# Patient Record
Sex: Male | Born: 1965 | Race: White | Hispanic: No | Marital: Married | Smoking: Never smoker
Health system: Southern US, Community
[De-identification: ages and names within clinical notes are randomized; demographics above are authoritative.]

## PROBLEM LIST (undated history)

## (undated) DIAGNOSIS — E039 Hypothyroidism, unspecified: Secondary | ICD-10-CM

## (undated) HISTORY — PX: MANDIBLE SURGERY: SHX707

## (undated) HISTORY — PX: COLONOSCOPY: SHX174

---

## 2013-12-19 ENCOUNTER — Encounter: Payer: Self-pay | Admitting: Internal Medicine

## 2015-10-09 ENCOUNTER — Other Ambulatory Visit: Payer: Self-pay | Admitting: Pediatrics

## 2015-10-09 MED ORDER — CLINDAMYCIN HCL 300 MG PO CAPS: 300.0000 mg | ORAL_CAPSULE | Freq: Four times a day (QID) | ORAL | Status: AC

## 2015-10-09 MED ORDER — CLOTRIMAZOLE 1 % EX CREA: 1.0000 "application " | TOPICAL_CREAM | Freq: Two times a day (BID) | CUTANEOUS | Status: DC

## 2015-10-09 MED ORDER — FLUCONAZOLE 150 MG PO TABS: ORAL_TABLET | ORAL | Status: DC

## 2020-06-21 ENCOUNTER — Ambulatory Visit: Payer: Self-pay

## 2020-08-19 ENCOUNTER — Other Ambulatory Visit: Payer: Self-pay

## 2020-08-19 DIAGNOSIS — Z20822 Contact with and (suspected) exposure to covid-19: Secondary | ICD-10-CM

## 2020-08-21 LAB — SARS-COV-2, NAA 2 DAY TAT

## 2020-08-21 LAB — NOVEL CORONAVIRUS, NAA: SARS-CoV-2, NAA: NOT DETECTED

## 2021-04-16 ENCOUNTER — Emergency Department (HOSPITAL_COMMUNITY): Payer: BC Managed Care – PPO

## 2021-04-16 ENCOUNTER — Other Ambulatory Visit: Payer: Self-pay

## 2021-04-16 ENCOUNTER — Emergency Department (HOSPITAL_COMMUNITY)
Admission: EM | Admit: 2021-04-16 | Discharge: 2021-04-16 | Disposition: A | Discharge status: Home / Self Care | Payer: BC Managed Care – PPO | Attending: Emergency Medicine | Admitting: Emergency Medicine

## 2021-04-16 ENCOUNTER — Encounter (HOSPITAL_COMMUNITY): Payer: Self-pay | Admitting: Emergency Medicine

## 2021-04-16 DIAGNOSIS — S0990XA Unspecified injury of head, initial encounter: Secondary | ICD-10-CM | POA: Insufficient documentation

## 2021-04-16 DIAGNOSIS — R911 Solitary pulmonary nodule: Secondary | ICD-10-CM | POA: Insufficient documentation

## 2021-04-16 DIAGNOSIS — Y92410 Unspecified street and highway as the place of occurrence of the external cause: Secondary | ICD-10-CM | POA: Insufficient documentation

## 2021-04-16 LAB — COMPREHENSIVE METABOLIC PANEL
ALT: 16 U/L (ref 0–44)
AST: 24 U/L (ref 15–41)
Albumin: 4 g/dL (ref 3.5–5.0)
Alkaline Phosphatase: 33 U/L — ABNORMAL LOW (ref 38–126)
Anion gap: 9 (ref 5–15)
BUN: 14 mg/dL (ref 6–20)
CO2: 25 mmol/L (ref 22–32)
Calcium: 9 mg/dL (ref 8.9–10.3)
Chloride: 107 mmol/L (ref 98–111)
Creatinine, Ser: 0.99 mg/dL (ref 0.61–1.24)
GFR, Estimated: >60 mL/min (ref >60)
Glucose, Bld: 94 mg/dL (ref 70–99)
Potassium: 3.7 mmol/L (ref 3.5–5.1)
Sodium: 141 mmol/L (ref 135–145)
Total Bilirubin: 1 mg/dL (ref 0.3–1.2)
Total Protein: 6.9 g/dL (ref 6.5–8.1)

## 2021-04-16 LAB — CBC
HCT: 45.4 % (ref 39.0–52.0)
Hemoglobin: 14.9 g/dL (ref 13.0–17.0)
MCH: 31.2 pg (ref 26.0–34.0)
MCHC: 32.8 g/dL (ref 30.0–36.0)
MCV: 95 fL (ref 80.0–100.0)
Platelets: 198 10*3/uL (ref 150–400)
RBC: 4.78 MIL/uL (ref 4.22–5.81)
RDW: 12.4 % (ref 11.5–15.5)
WBC: 6.1 10*3/uL (ref 4.0–10.5)
nRBC: 0 % (ref 0.0–0.2)

## 2021-04-16 LAB — ETHANOL: Alcohol, Ethyl (B): <10 mg/dL (ref <10)

## 2021-04-16 MED ORDER — ACETAMINOPHEN 325 MG PO TABS: 650.0000 mg | ORAL_TABLET | Freq: Once | ORAL | Status: DC

## 2021-04-16 MED ORDER — METHOCARBAMOL 500 MG PO TABS: 500.0000 mg | ORAL_TABLET | Freq: Two times a day (BID) | ORAL | 0 refills | Status: DC

## 2021-04-16 NOTE — ED Notes (Signed)
Patient transported to CT 
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
## 2021-04-16 NOTE — Discharge Instructions (Addendum)
You are seenYou were evaluated in the Emergency Department and after careful evaluation, we did not find any emergent condition requiring admission or further testing in the hospital.  Your scans today were overall reassuring. The  CT scan of your thoracic spine  noted a nodule on your right upper lung.  This may be benign, but I do recommend that you follow-up with your primary care doctor for this.  You will likely experience aches and pains, you may take Tylenol or ibuprofen for pain.  I have also prescribed a muscle relaxer, do not drink or drive on this medication and take this medication at night as it can make you sleepy.  You may experience concussion symptoms including headache, sensitivity to light.  Please abstain from looking at screens, and take it easy over the next few days.  Please return to the ER if you have worsening confusion, sleepiness, nausea, vomiting, or any other new or concerning symptoms.  Please return to the Emergency Department if you experience any worsening of your condition.  We encourage you to follow up with a primary care provider.  Thank you for allowing [place] to be a part of your care.
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
## 2021-07-14 ENCOUNTER — Other Ambulatory Visit: Payer: Self-pay | Admitting: Family Medicine

## 2021-07-14 DIAGNOSIS — R911 Solitary pulmonary nodule: Secondary | ICD-10-CM

## 2021-08-05 ENCOUNTER — Other Ambulatory Visit: Payer: Self-pay

## 2021-08-05 ENCOUNTER — Ambulatory Visit
Admission: RE | Admit: 2021-08-05 | Discharge: 2021-08-05 | Disposition: A | Discharge status: Home / Self Care | Payer: BC Managed Care – PPO | Source: Ambulatory Visit | Attending: Family Medicine | Admitting: Family Medicine

## 2021-08-05 DIAGNOSIS — R911 Solitary pulmonary nodule: Secondary | ICD-10-CM

## 2021-08-26 ENCOUNTER — Ambulatory Visit: Payer: Self-pay | Admitting: Surgery

## 2021-08-26 NOTE — H&P (Signed)
History of Present Illness: Randy Peters is a 56 y.o. male who was referred to me for evaluation of a mass on the lower back.  He says it has been present for very long time, about 20 years.  It occasionally drains fluid which she says is foul-smelling.  It does not cause him pain but he would like to have it removed.  He is otherwise in good health.       Review of Systems: A complete review of systems was obtained from the patient.  I have reviewed this information and discussed as appropriate with the patient.  See HPI as well for other ROS.       Medical History: Past Medical History Past Medical History: Diagnosis Date  Thyroid disease        Patient Active Problem List Diagnosis  Epidermoid cyst of skin of back     Past Surgical History Past Surgical History: Procedure Laterality Date  jaw surgery   1985      Allergies No Known Allergies    Current Outpatient Medications on File Prior to Visit Medication Sig Dispense Refill  levothyroxine (SYNTHROID) 50 MCG tablet TAKE 1 TABLET BY MOUTH EVERY DAY IN THE MORNING ON AN EMPTY STOMACH       No current facility-administered medications on file prior to visit.     Family History Family History Problem Relation Age of Onset  Stroke Father        Social History   Tobacco Use Smoking Status Never Smokeless Tobacco Never     Social History Social History    Socioeconomic History  Marital status: Married Tobacco Use  Smoking status: Never  Smokeless tobacco: Never Substance and Sexual Activity  Alcohol use: Never  Drug use: Never      Objective:     Vitals:   08/26/21 1055 BP: 110/84 Pulse: 67 Temp: 36.6 C (97.9 F) SpO2: 98% Weight: 67.5 kg (148 lb 12.8 oz) Height: 180.3 cm (5\' 11" )   Body mass index is 20.75 kg/m.   Physical Exam Vitals reviewed.  Constitutional:      General: He is not in acute distress.    Appearance: Normal appearance.  HENT:     Head: Normocephalic and  atraumatic.  Eyes:     General: No scleral icterus.    Conjunctiva/sclera: Conjunctivae normal.  Cardiovascular:     Rate and Rhythm: Normal rate and regular rhythm.  Pulmonary:     Effort: Pulmonary effort is normal. No respiratory distress.     Breath sounds: Normal breath sounds.  Musculoskeletal:        General: No swelling or deformity. Normal range of motion.     Cervical back: Normal range of motion.  Skin:    General: Skin is warm and dry.     Comments: Exophytic subcutaneous mass on the lower back just to the left of midline, approximately 4 cm in diameter.  Mass is fluctuant but there is no overlying skin erythema or induration.  Neurological:     General: No focal deficit present.     Mental Status: He is alert and oriented to person, place, and time.  Psychiatric:        Mood and Affect: Mood normal.        Behavior: Behavior normal.        Thought Content: Thought content normal.            Assessment and Plan: Diagnoses and all orders for this visit:   Epidermoid cyst  of skin of back       This is a 56 year old male with a benign cystic lesion on the lower back, present for many years.  He would like to have it removed.  I recommended excision at one of the outpatient surgery centers.  I discussed the details of the procedure and he agrees to proceed.  He will be contacted to schedule an elective surgery date.  Michaelle Birks, MD [facility] General, Hepatobiliary and Pancreatic Surgery 08/26/21 11:50 AM
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
## 2021-12-17 ENCOUNTER — Encounter (HOSPITAL_BASED_OUTPATIENT_CLINIC_OR_DEPARTMENT_OTHER): Payer: Self-pay | Admitting: Surgery

## 2021-12-17 ENCOUNTER — Other Ambulatory Visit: Payer: Self-pay

## 2021-12-24 ENCOUNTER — Ambulatory Visit: Payer: Self-pay | Admitting: Surgery

## 2021-12-25 ENCOUNTER — Encounter (HOSPITAL_BASED_OUTPATIENT_CLINIC_OR_DEPARTMENT_OTHER): Payer: Self-pay | Admitting: Surgery

## 2021-12-25 ENCOUNTER — Ambulatory Visit (HOSPITAL_BASED_OUTPATIENT_CLINIC_OR_DEPARTMENT_OTHER)
Admission: RE | Admit: 2021-12-25 | Discharge: 2021-12-25 | Disposition: A | Discharge status: Home / Self Care | Payer: BC Managed Care – PPO | Attending: Surgery | Admitting: Surgery

## 2021-12-25 ENCOUNTER — Other Ambulatory Visit: Payer: Self-pay

## 2021-12-25 ENCOUNTER — Encounter (HOSPITAL_BASED_OUTPATIENT_CLINIC_OR_DEPARTMENT_OTHER)
Admission: RE | Disposition: A | Discharge status: Home / Self Care | Payer: Self-pay | Source: Home / Self Care | Attending: Surgery

## 2021-12-25 ENCOUNTER — Ambulatory Visit (HOSPITAL_BASED_OUTPATIENT_CLINIC_OR_DEPARTMENT_OTHER): Payer: BC Managed Care – PPO | Admitting: Anesthesiology

## 2021-12-25 DIAGNOSIS — L720 Epidermal cyst: Secondary | ICD-10-CM | POA: Diagnosis present

## 2021-12-25 DIAGNOSIS — E039 Hypothyroidism, unspecified: Secondary | ICD-10-CM | POA: Insufficient documentation

## 2021-12-25 HISTORY — DX: Hypothyroidism, unspecified: E03.9

## 2021-12-25 HISTORY — PX: CYST EXCISION: SHX5701

## 2021-12-25 SURGERY — CYST REMOVAL
Anesthesia: General | Site: Back

## 2021-12-25 MED ORDER — 0.9 % SODIUM CHLORIDE (POUR BTL) OPTIME
TOPICAL | Status: DC | PRN
  Administered 2021-12-25: 120 mL

## 2021-12-25 MED ORDER — KETOROLAC TROMETHAMINE 30 MG/ML IJ SOLN
INTRAMUSCULAR | Status: DC | PRN
  Administered 2021-12-25: 30 mg via INTRAVENOUS

## 2021-12-25 MED ORDER — MIDAZOLAM HCL 2 MG/2ML IJ SOLN
INTRAMUSCULAR | Status: AC
  Filled 2021-12-25: qty 2

## 2021-12-25 MED ORDER — FENTANYL CITRATE (PF) 100 MCG/2ML IJ SOLN
INTRAMUSCULAR | Status: AC
  Filled 2021-12-25: qty 2

## 2021-12-25 MED ORDER — LIDOCAINE 2% (20 MG/ML) 5 ML SYRINGE
INTRAMUSCULAR | Status: AC
  Filled 2021-12-25: qty 5

## 2021-12-25 MED ORDER — DEXAMETHASONE SODIUM PHOSPHATE 4 MG/ML IJ SOLN
INTRAMUSCULAR | Status: DC | PRN
Start: 2021-12-25 — End: 2021-12-25
  Administered 2021-12-25: 10 mg via INTRAVENOUS

## 2021-12-25 MED ORDER — BUPIVACAINE-EPINEPHRINE (PF) 0.25% -1:200000 IJ SOLN
INTRAMUSCULAR | Status: DC | PRN
  Administered 2021-12-25: 20 mL via PERINEURAL

## 2021-12-25 MED ORDER — ONDANSETRON HCL 4 MG/2ML IJ SOLN: 4.0000 mg | Freq: Once | INTRAMUSCULAR | Status: DC | PRN

## 2021-12-25 MED ORDER — PROPOFOL 10 MG/ML IV BOLUS
INTRAVENOUS | Status: DC | PRN
Start: 2021-12-25 — End: 2021-12-25
  Administered 2021-12-25: 150 mg via INTRAVENOUS

## 2021-12-25 MED ORDER — OXYCODONE HCL 5 MG/5ML PO SOLN: 5.0000 mg | Freq: Once | ORAL | Status: DC | PRN

## 2021-12-25 MED ORDER — ONDANSETRON HCL 4 MG/2ML IJ SOLN
INTRAMUSCULAR | Status: DC | PRN
  Administered 2021-12-25: 4 mg via INTRAVENOUS

## 2021-12-25 MED ORDER — OXYCODONE HCL 5 MG PO TABS: 5.0000 mg | ORAL_TABLET | Freq: Once | ORAL | Status: DC | PRN

## 2021-12-25 MED ORDER — CEFAZOLIN SODIUM-DEXTROSE 2-4 GM/100ML-% IV SOLN
INTRAVENOUS | Status: AC
  Filled 2021-12-25: qty 100

## 2021-12-25 MED ORDER — BUPIVACAINE HCL (PF) 0.25 % IJ SOLN
INTRAMUSCULAR | Status: AC
  Filled 2021-12-25: qty 30

## 2021-12-25 MED ORDER — LACTATED RINGERS IV SOLN: INTRAVENOUS | Status: DC

## 2021-12-25 MED ORDER — ACETAMINOPHEN 500 MG PO TABS
1000.0000 mg | ORAL_TABLET | Freq: Once | ORAL | Status: AC
  Administered 2021-12-25: 1000 mg via ORAL

## 2021-12-25 MED ORDER — MIDAZOLAM HCL 5 MG/5ML IJ SOLN
INTRAMUSCULAR | Status: DC | PRN
  Administered 2021-12-25: 2 mg via INTRAVENOUS

## 2021-12-25 MED ORDER — AMISULPRIDE (ANTIEMETIC) 5 MG/2ML IV SOLN: 10.0000 mg | Freq: Once | INTRAVENOUS | Status: DC | PRN

## 2021-12-25 MED ORDER — ONDANSETRON HCL 4 MG/2ML IJ SOLN
INTRAMUSCULAR | Status: AC
  Filled 2021-12-25: qty 2

## 2021-12-25 MED ORDER — ACETAMINOPHEN 500 MG PO TABS
ORAL_TABLET | ORAL | Status: AC
  Filled 2021-12-25: qty 2

## 2021-12-25 MED ORDER — TRAMADOL HCL 50 MG PO TABS: 50.0000 mg | ORAL_TABLET | Freq: Four times a day (QID) | ORAL | 0 refills | Status: AC | PRN

## 2021-12-25 MED ORDER — ROCURONIUM BROMIDE 100 MG/10ML IV SOLN
INTRAVENOUS | Status: DC | PRN
  Administered 2021-12-25: 60 mg via INTRAVENOUS

## 2021-12-25 MED ORDER — FENTANYL CITRATE (PF) 100 MCG/2ML IJ SOLN: 25.0000 ug | INTRAMUSCULAR | Status: DC | PRN

## 2021-12-25 MED ORDER — SUGAMMADEX SODIUM 200 MG/2ML IV SOLN
INTRAVENOUS | Status: DC | PRN
  Administered 2021-12-25: 200 mg via INTRAVENOUS

## 2021-12-25 MED ORDER — DEXAMETHASONE SODIUM PHOSPHATE 10 MG/ML IJ SOLN
INTRAMUSCULAR | Status: AC
  Filled 2021-12-25: qty 1

## 2021-12-25 MED ORDER — ROCURONIUM BROMIDE 10 MG/ML (PF) SYRINGE
PREFILLED_SYRINGE | INTRAVENOUS | Status: AC
  Filled 2021-12-25: qty 10

## 2021-12-25 MED ORDER — PROPOFOL 500 MG/50ML IV EMUL
INTRAVENOUS | Status: AC
  Filled 2021-12-25: qty 50

## 2021-12-25 MED ORDER — CEFAZOLIN SODIUM-DEXTROSE 2-4 GM/100ML-% IV SOLN
2.0000 g | INTRAVENOUS | Status: AC
  Administered 2021-12-25: 2 g via INTRAVENOUS

## 2021-12-25 MED ORDER — FENTANYL CITRATE (PF) 100 MCG/2ML IJ SOLN
INTRAMUSCULAR | Status: DC | PRN
  Administered 2021-12-25 (×2): 50 ug via INTRAVENOUS

## 2021-12-25 SURGICAL SUPPLY — 43 items
ADH SKN CLS APL DERMABOND .7 (GAUZE/BANDAGES/DRESSINGS)
APL PRP STRL LF DISP 70% ISPRP (MISCELLANEOUS) ×1
APL SKNCLS STERI-STRIP NONHPOA (GAUZE/BANDAGES/DRESSINGS)
BENZOIN TINCTURE PRP APPL 2/3 (GAUZE/BANDAGES/DRESSINGS) IMPLANT
BLADE SURG 15 STRL LF DISP TIS (BLADE) ×1 IMPLANT
BLADE SURG 15 STRL SS (BLADE) ×2
CANISTER SUCT 1200ML W/VALVE (MISCELLANEOUS) IMPLANT
CHLORAPREP W/TINT 26 (MISCELLANEOUS) ×2 IMPLANT
COVER BACK TABLE 60X90IN (DRAPES) ×2 IMPLANT
COVER MAYO STAND STRL (DRAPES) ×2 IMPLANT
DERMABOND ADVANCED (GAUZE/BANDAGES/DRESSINGS)
DERMABOND ADVANCED .7 DNX12 (GAUZE/BANDAGES/DRESSINGS) IMPLANT
DRAPE LAPAROTOMY 100X72 PEDS (DRAPES) ×2 IMPLANT
DRAPE UTILITY XL STRL (DRAPES) ×2 IMPLANT
DRSG TEGADERM 2-3/8X2-3/4 SM (GAUZE/BANDAGES/DRESSINGS) IMPLANT
DRSG TEGADERM 4X4.75 (GAUZE/BANDAGES/DRESSINGS) IMPLANT
ELECT REM PT RETURN 9FT ADLT (ELECTROSURGICAL) ×2
ELECTRODE REM PT RTRN 9FT ADLT (ELECTROSURGICAL) ×1 IMPLANT
GAUZE SPONGE 4X4 12PLY STRL LF (GAUZE/BANDAGES/DRESSINGS) IMPLANT
GLOVE BIOGEL PI IND STRL 6 (GLOVE) ×1 IMPLANT
GLOVE BIOGEL PI INDICATOR 6 (GLOVE) ×1
GLOVE SURG POLY MICRO LF SZ5.5 (GLOVE) ×2 IMPLANT
GOWN STRL REUS W/ TWL LRG LVL3 (GOWN DISPOSABLE) ×2 IMPLANT
GOWN STRL REUS W/TWL LRG LVL3 (GOWN DISPOSABLE) ×4
NDL HYPO 25X1 1.5 SAFETY (NEEDLE) ×1 IMPLANT
NEEDLE HYPO 25X1 1.5 SAFETY (NEEDLE) ×2 IMPLANT
PACK BASIN DAY SURGERY FS (CUSTOM PROCEDURE TRAY) ×2 IMPLANT
PENCIL SMOKE EVACUATOR (MISCELLANEOUS) ×2 IMPLANT
SLEEVE SCD COMPRESS KNEE MED (STOCKING) ×2 IMPLANT
SPIKE FLUID TRANSFER (MISCELLANEOUS) IMPLANT
SPONGE T-LAP 4X18 ~~LOC~~+RFID (SPONGE) ×2 IMPLANT
STRIP CLOSURE SKIN 1/2X4 (GAUZE/BANDAGES/DRESSINGS) IMPLANT
SUT MON AB 4-0 PC3 18 (SUTURE) ×2 IMPLANT
SUT SILK 2 0 TIES 17X18 (SUTURE)
SUT SILK 2-0 18XBRD TIE BLK (SUTURE) IMPLANT
SUT VIC AB 3-0 SH 27 (SUTURE) ×2
SUT VIC AB 3-0 SH 27X BRD (SUTURE) ×1 IMPLANT
SUT VICRYL 3-0 CR8 SH (SUTURE) IMPLANT
SYR BULB EAR ULCER 3OZ GRN STR (SYRINGE) ×1 IMPLANT
SYR CONTROL 10ML LL (SYRINGE) ×2 IMPLANT
TOWEL GREEN STERILE FF (TOWEL DISPOSABLE) ×2 IMPLANT
TUBE CONNECTING 20X1/4 (TUBING) IMPLANT
YANKAUER SUCT BULB TIP NO VENT (SUCTIONS) IMPLANT

## 2021-12-25 NOTE — Anesthesia Postprocedure Evaluation (Signed)
Anesthesia Post Note ? ?Patient: Randy Peters ? ?Procedure(s) Performed: EXCISION OF LOWER BACK CYST (Back) ? ?  ? ?Patient location during evaluation: PACU ?Anesthesia Type: General ?Level of consciousness: awake and alert, patient cooperative and oriented ?Pain management: pain level controlled ?Vital Signs Assessment: post-procedure vital signs reviewed and stable ?Respiratory status: spontaneous breathing, nonlabored ventilation and respiratory function stable ?Cardiovascular status: blood pressure returned to baseline and stable ?Postop Assessment: no apparent nausea or vomiting, able to ambulate and adequate PO intake ?Anesthetic complications: no ? ? ?No notable events documented. ? ?Last Vitals:  ?Vitals:  ? 12/25/21 0915 12/25/21 0939  ?BP: 99/70 98/73  ?Pulse: (!) 54 (!) 51  ?Resp: 17 18  ?Temp:  (!) 36.3 ?C  ?SpO2: 100% 98%  ?  ?Last Pain:  ?Vitals:  ? 12/25/21 0939  ?TempSrc: Oral  ?PainSc: 0-No pain  ? ? ?  ?  ?  ?  ?  ?  ? ?Eilee Schader,E. Azel Gumina ? ? ? ? ?

## 2021-12-25 NOTE — Anesthesia Preprocedure Evaluation (Signed)
Anesthesia Evaluation  ?Patient identified by MRN, date of birth, ID band ?Patient awake ? ? ? ?Reviewed: ?Allergy & Precautions, NPO status , Patient's Chart, lab work & pertinent test results ? ?History of Anesthesia Complications ?Negative for: history of anesthetic complications ? ?Airway ?Mallampati: II ? ?TM Distance: >3 FB ?Neck ROM: Full ? ? ? Dental ? ?(+) Dental Advisory Given, Teeth Intact ?  ?Pulmonary ?neg pulmonary ROS,  ?  ?Pulmonary exam normal ? ? ? ? ? ? ? Cardiovascular ?negative cardio ROS ?Normal cardiovascular exam ? ? ?  ?Neuro/Psych ?negative neurological ROS ?   ? GI/Hepatic ?negative GI ROS, Neg liver ROS,   ?Endo/Other  ?Hypothyroidism  ? Renal/GU ?negative Renal ROS  ?negative genitourinary ?  ?Musculoskeletal ?negative musculoskeletal ROS ?(+)  ? Abdominal ?  ?Peds ? Hematology ?negative hematology ROS ?(+)   ?Anesthesia Other Findings ? ? Reproductive/Obstetrics ? ?  ? ? ? ? ? ? ? ? ? ? ? ? ? ?  ?  ? ? ? ? ? ? ? ?Anesthesia Physical ?Anesthesia Plan ? ?ASA: 2 ? ?Anesthesia Plan: General  ? ?Post-op Pain Management: Tylenol PO (pre-op)* and Toradol IV (intra-op)*  ? ?Induction: Intravenous ? ?PONV Risk Score and Plan: 2 and Ondansetron, Dexamethasone, Treatment may vary due to age or medical condition and Midazolam ? ?Airway Management Planned: Oral ETT ? ?Additional Equipment: None ? ?Intra-op Plan:  ? ?Post-operative Plan: Extubation in OR ? ?Informed Consent: I have reviewed the patients History and Physical, chart, labs and discussed the procedure including the risks, benefits and alternatives for the proposed anesthesia with the patient or authorized representative who has indicated his/her understanding and acceptance.  ? ? ? ?Dental advisory given ? ?Plan Discussed with:  ? ?Anesthesia Plan Comments:   ? ? ? ? ? ? ?Anesthesia Quick Evaluation ? ?

## 2021-12-25 NOTE — Op Note (Signed)
Date: 12/25/21 ? ?Patient: Randy Peters ?MRN: [id number] ? ?Preoperative Diagnosis: Cyst of lower back ?Postoperative Diagnosis: Inclusion cyst of lower back ? ?Procedure: Excision of epidermal inclusion cyst ? ?Surgeon: Michaelle Birks, MD ? ?EBL: Minimal ? ?Anesthesia: General ? ?Specimens: Lower back cyst ? ?Indications: Mr. Yohey is a 56 yo male who presented with a cystic mass on the lower back for many years, which intermittently drained fluid. He elected to proceed with surgical excision. ? ?Findings: 4cm inclusion cyst on the lower back near the midline. ? ?Procedure details: Informed consent was obtained in the preoperative area prior to the procedure. The patient was brought to the operating room, general anesthesia was induced, and the patient was placed in the prone position. Perioperative antibiotics were administered per SCIP guidelines. The patient was prepped and draped in the usual sterile fashion. A pre-procedure timeout was taken verifying patient identity, surgical site and procedure to be performed. ? ?An elliptical skin incision was made overlying the cyst. The cyst was very superficial and was just deep to the dermis. The cyst wall was dissected off the surrounding subcutaneous tissue using blunt dissection and cautery. It was completely excised, and the excised diameter was approximately 4cm. The specimen was sent for routine pathology. The wound was irrigated and hemostasis was achieved with cautery. The redundant skin edges were sharply excised. The deep dermis was closed with interrupted 3-0 Vicryl sutures. The skin was closed with a running subcuticular 4-0 monocryl suture and dermabond was applied. ? ?The patient tolerated the procedure well with no apparent complications. All counts were correct x2 at the end of the procedure. The patient was extubated and taken to PACU in stable condition. ? ?Michaelle Birks, MD ?12/25/21 ?8:41 AM ? ? ?

## 2021-12-25 NOTE — Transfer of Care (Signed)
Immediate Anesthesia Transfer of Care Note ? ?Patient: Randy Peters ? ?Procedure(s) Performed: EXCISION OF LOWER BACK CYST (Back) ? ?Patient Location: PACU ? ?Anesthesia Type:General ? ?Level of Consciousness: sedated ? ?Airway & Oxygen Therapy: Patient Spontanous Breathing and Patient connected to face mask oxygen ? ?Post-op Assessment: Report given to RN and Post -op Vital signs reviewed and stable ? ?Post vital signs: Reviewed and stable ? ?Last Vitals:  ?Vitals Value Taken Time  ?BP 106/75 12/25/21 0855  ?Temp    ?Pulse 54 12/25/21 0857  ?Resp    ?SpO2 100 % 12/25/21 0857  ?Vitals shown include unvalidated device data. ? ?Last Pain:  ?Vitals:  ? 12/25/21 0704  ?TempSrc: Oral  ?PainSc: 0-No pain  ?   ? ?  ? ?Complications: No notable events documented. ?

## 2021-12-25 NOTE — H&P (Signed)
Randy Peters is an 56 y.o. male.   ?Chief Complaint: cyst on back ?HPI: Randy Peters is a 56 yo male with a cystic mass on the lower back. It has been present for many years and occasionally drains fluid. He would like to have it removed and presents today for surgical excision. ? ?Past Medical History:  ?Diagnosis Date  ? Hypothyroidism   ? ? ?Past Surgical History:  ?Procedure Laterality Date  ? COLONOSCOPY    ? MANDIBLE SURGERY    ? ? ?History reviewed. No pertinent family history. ?Social History:  reports that he has never smoked. He has never used smokeless tobacco. He reports current alcohol use. He reports that he does not use drugs. ? ?Allergies: No Known Allergies ? ?Medications Prior to Admission  ?Medication Sig Dispense Refill  ? levothyroxine (SYNTHROID) 50 MCG tablet Take 50 mcg by mouth daily before breakfast.    ? ? ?No results found for this or any previous visit (from the past 48 hour(s)). ?No results found. ? ?Review of Systems ? ?Blood pressure 110/83, pulse (!) 58, temperature 97.9 ?F (36.6 ?C), temperature source Oral, resp. rate 15, height 5\' 11"  (1.803 m), weight 68.4 kg, SpO2 100 %. ?Physical Exam ?Vitals reviewed.  ?Constitutional:   ?   General: He is not in acute distress. ?   Appearance: Normal appearance.  ?HENT:  ?   Head: Normocephalic and atraumatic.  ?Eyes:  ?   General: No scleral icterus. ?   Conjunctiva/sclera: Conjunctivae normal.  ?Pulmonary:  ?   Effort: Pulmonary effort is normal. No respiratory distress.  ?Musculoskeletal:     ?   General: No deformity. Normal range of motion.  ?Skin: ?   General: Skin is warm and dry.  ?   Comments: Exophytic mass on lower back to the left of midline, soft and well-circumscribed, approximately 3cm in diameter, no overlying skin defects.  ?Neurological:  ?   General: No focal deficit present.  ?   Mental Status: He is alert and oriented to person, place, and time.  ?Psychiatric:     ?   Mood and Affect: Mood normal.     ?   Behavior:  Behavior normal.     ?   Thought Content: Thought content normal.  ?  ? ?Assessment/Plan ?56 yo male with cyst on lower back. Proceed to OR for excision. Informed consent obtained, all questions answered, postop instructions reviewed. Plan for discharge home from PACU. ? ?58, MD ?12/25/2021, 7:25 AM ? ? ? ?

## 2021-12-25 NOTE — Anesthesia Procedure Notes (Signed)
Procedure Name: Intubation ?Date/Time: 12/25/2021 7:54 AM ?Performed by: Maryella Shivers, CRNA ?Pre-anesthesia Checklist: Patient identified, Emergency Drugs available, Suction available and Patient being monitored ?Patient Re-evaluated:Patient Re-evaluated prior to induction ?Oxygen Delivery Method: Circle system utilized ?Preoxygenation: Pre-oxygenation with 100% oxygen ?Induction Type: IV induction ?Ventilation: Mask ventilation with difficulty ?Laryngoscope Size: Mac and 4 ?Grade View: Grade III ?Tube type: Oral ?Tube size: 7.5 mm ?Number of attempts: 1 ?Airway Equipment and Method: Stylet and Oral airway ?Placement Confirmation: ETT inserted through vocal cords under direct vision, positive ETCO2 and breath sounds checked- equal and bilateral ?Secured at: 22 cm ?Tube secured with: Tape ?Dental Injury: Teeth and Oropharynx as per pre-operative assessment  ?Comments: Slightly anterior ? ? ? ? ?

## 2021-12-25 NOTE — Discharge Instructions (Addendum)
[facility] DISCHARGE INSTRUCTIONS ? ?Activity ?Ok to shower in 24 hours, but do not bathe or submerge incision underwater. ?Do not drive while taking narcotic pain medication. ? ?Wound Care ?Your incision is covered with skin glue called Dermabond. This will peel off on its own over time. ?You may shower and allow warm soapy water to run over your incision in 24 hours after surgery. Gently pat dry. ?Do not submerge your incision underwater. ?Monitor your incision for any new redness, tenderness, or drainage. ? ?When to Call us: ?Fever greater than 100.5 ?New redness, drainage, or swelling at incision site ?Severe pain, nausea, or vomiting ? ?Follow-up ?You will have a postoperative visit with Dr. Freida Busman in about 1 month. This will be at the [facility] office at [address], [address], [place], [place]. Please arrive at least 15 minutes prior to your scheduled appointment time. ? ?For questions or concerns, please call the office at [phone]. ? ? ?Post Anesthesia Home Care Instructions ? ?Activity: ?Get plenty of rest for the remainder of the day. A responsible individual must stay with you for 24 hours following the procedure.  ?For the next 24 hours, DO NOT: ?-Drive a car ?-Advertising copywriter ?-Drink alcoholic beverages ?-Take any medication unless instructed by your physician ?-Make any legal decisions or sign important papers. ? ?Meals: ?Start with liquid foods such as gelatin or soup. Progress to regular foods as tolerated. Avoid greasy, spicy, heavy foods. If nausea and/or vomiting occur, drink only clear liquids until the nausea and/or vomiting subsides. Call your physician if vomiting continues. ? ?Special Instructions/Symptoms: ?Your throat may feel dry or sore from the anesthesia or the breathing tube placed in your throat during surgery. If this causes discomfort, gargle with warm salt water. The discomfort should disappear within 24 hours. ? ?If you had a scopolamine  patch placed behind your ear for the management of post- operative nausea and/or vomiting: ? ?1. The medication in the patch is effective for 72 hours, after which it should be removed.  Wrap patch in a tissue and discard in the trash. Wash hands thoroughly with soap and water. ?2. You may remove the patch earlier than 72 hours if you experience unpleasant side effects which may include dry mouth, dizziness or visual disturbances. ?3. Avoid touching the patch. Wash your hands with soap and water after contact with the patch. ? ? ?No tylenol until after 1pm if needed today.  No ibuprofen until after 1pm if needed today. ?    ? ?

## 2021-12-28 ENCOUNTER — Encounter (HOSPITAL_BASED_OUTPATIENT_CLINIC_OR_DEPARTMENT_OTHER): Payer: Self-pay | Admitting: Surgery

## 2021-12-28 LAB — SURGICAL PATHOLOGY

## 2022-01-14 IMAGING — CT CT CHEST W/O CM
1 series · 15 of 34 positions shown, 19 images · non-contrast
Comparison: CT thoracic spine 04/16/2021

CLINICAL DATA: Lung nodule, follow-up, abnormal CT thoracic spine

EXAM:
CT CHEST WITHOUT CONTRAST
TECHNIQUE: Multidetector CT imaging of the chest was performed following the
standard protocol without IV contrast.

[Series 2: chest w/(date) · axial · 0.66mm/px · z∈[-324,-36]mm · 15 of 170 slices shown, 19 images]
[im 13/170  mediastinal]
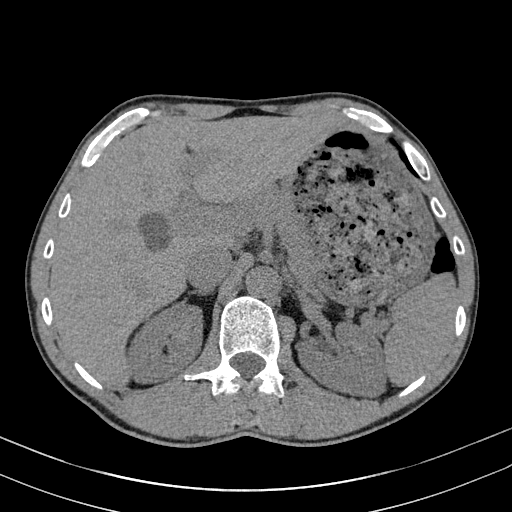
[im 13/170  lung]
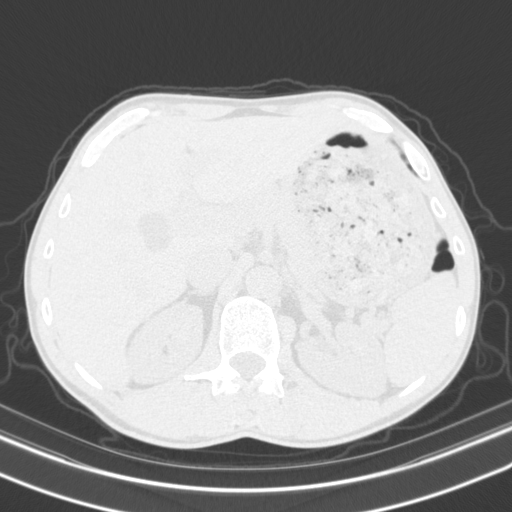
[im 26/170  lung]
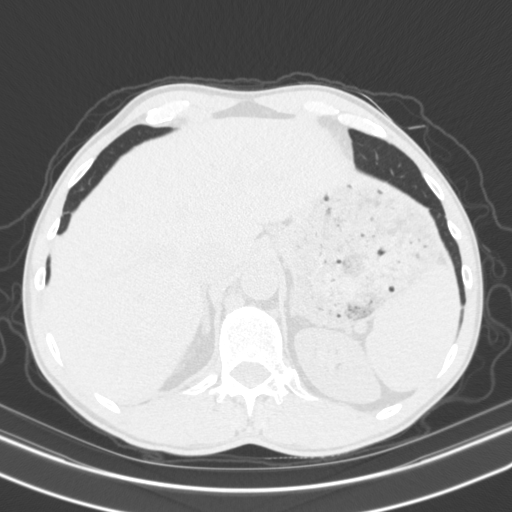
[im 34/170  lung]
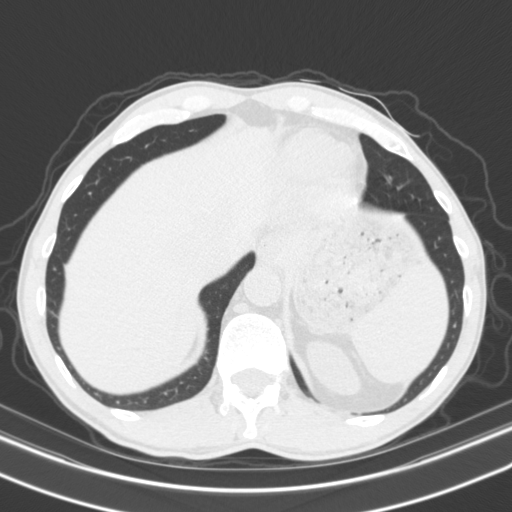
[im 44/170  lung]
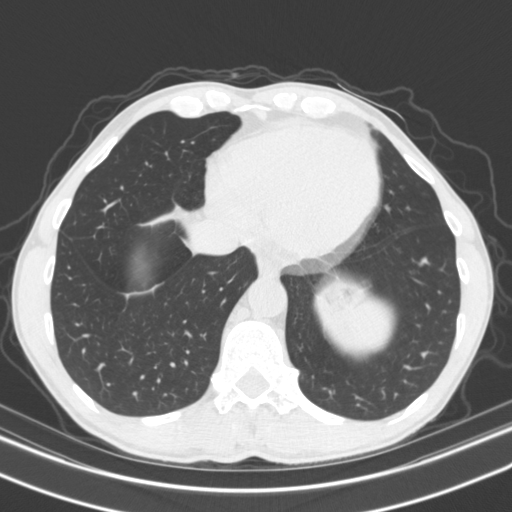
[im 57/170  mediastinal]
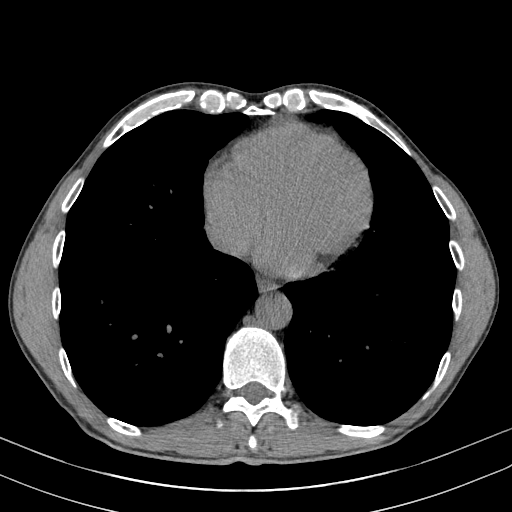
[im 57/170  lung]
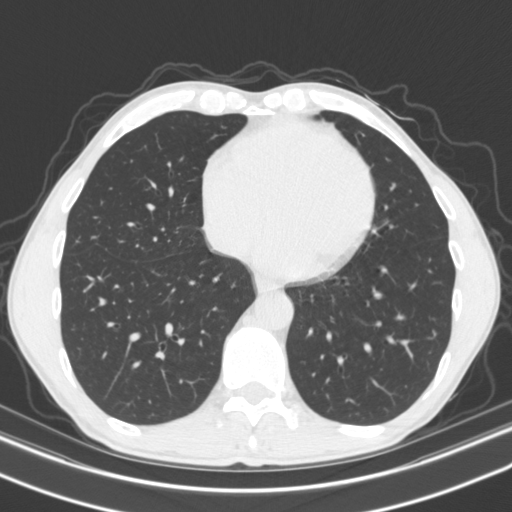
[im 68/170  lung]
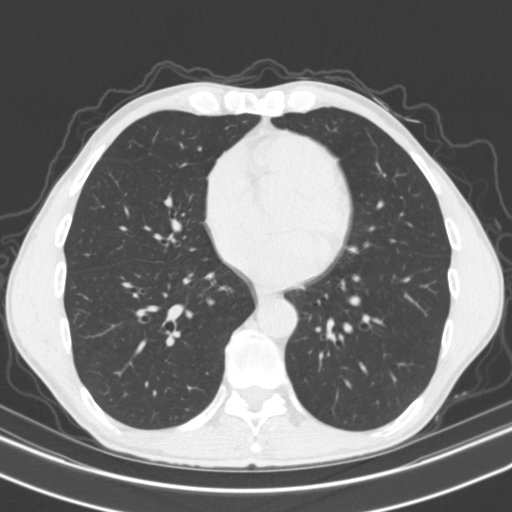
[im 76/170  lung]
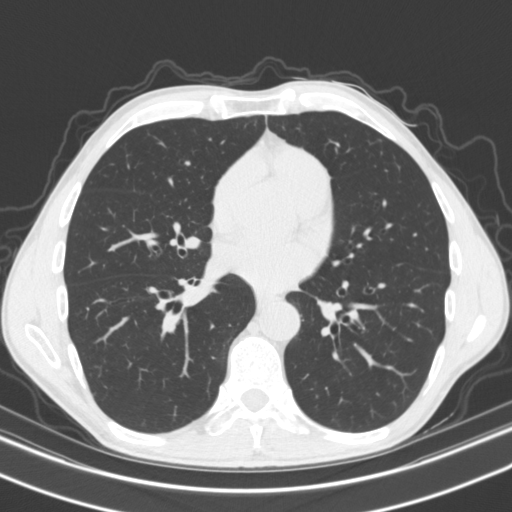
[im 88/170  lung]
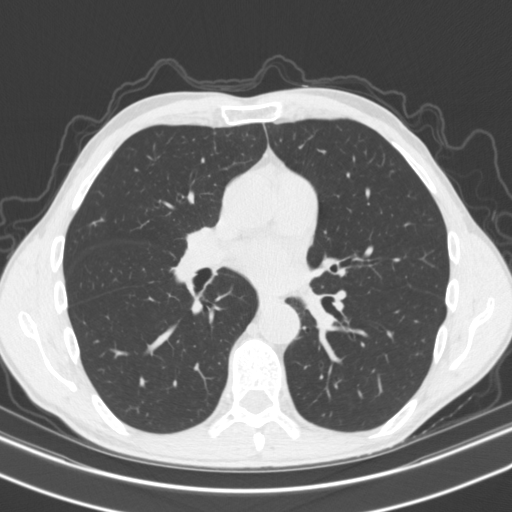
[im 94/170  mediastinal]
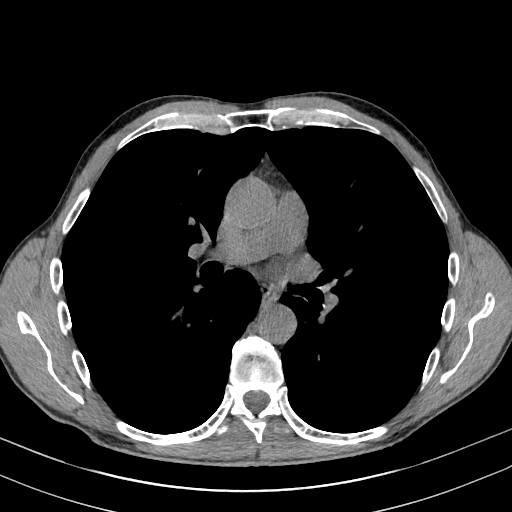
[im 94/170  lung]
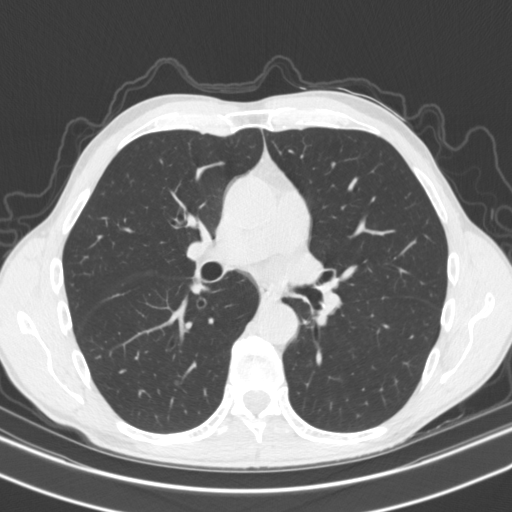
[im 102/170  lung]
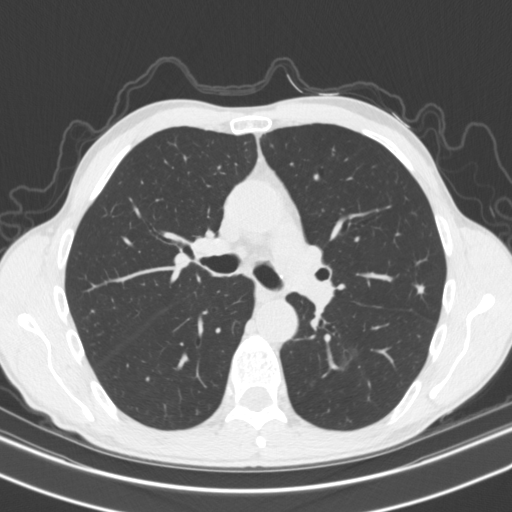
[im 113/170  lung]
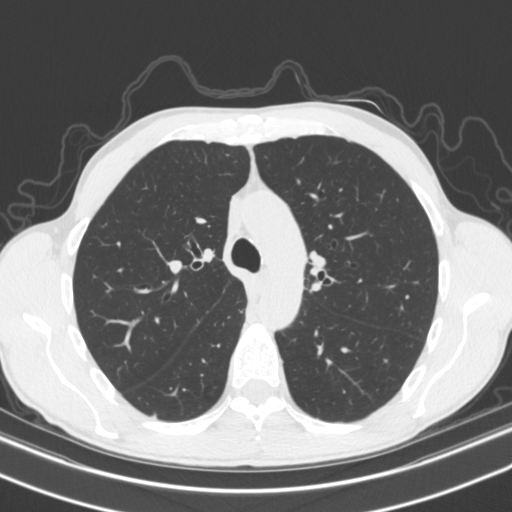
[im 126/170  lung]
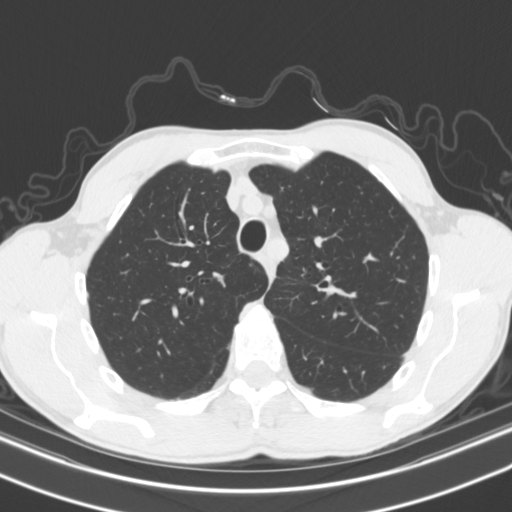
[im 136/170  mediastinal]
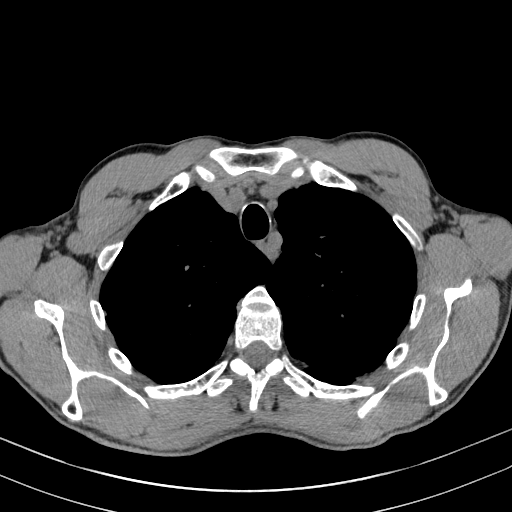
[im 136/170  lung]
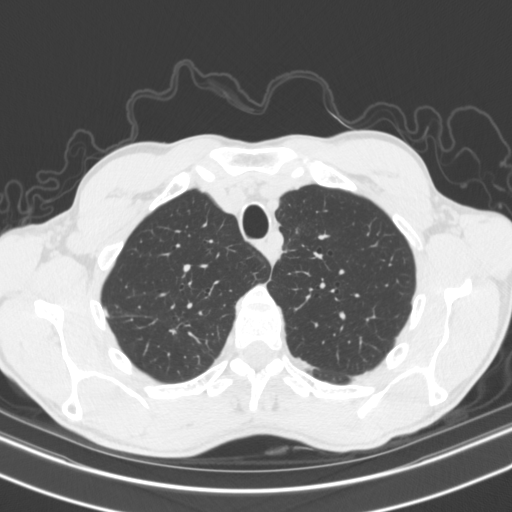
[im 144/170  lung]
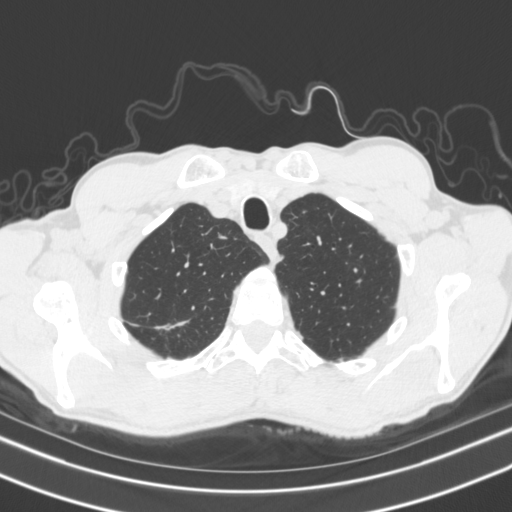
[im 157/170  lung]
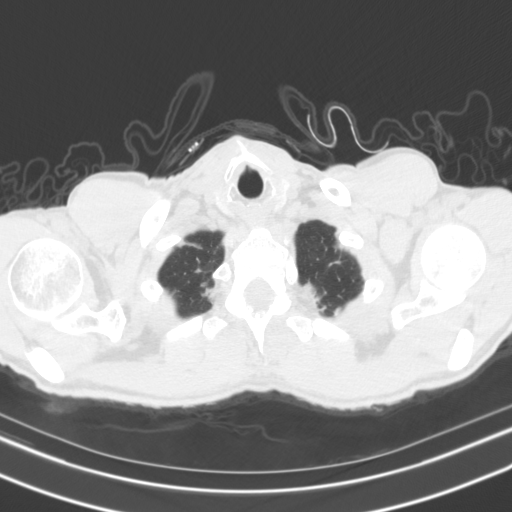

[15 of 34 positions shown; findings below may reference images not displayed]

FINDINGS: Cardiovascular: Aorta normal caliber. Heart size normal. No
pericardial effusion.

Mediastinum/Nodes: Base of cervical region normal appearance.
Esophagus unremarkable. No thoracic adenopathy.

Lungs/Pleura: Biapical scarring. Lenticular opacity in the RIGHT
upper lobe 18 x 7 mm, unchanged by my measurement, with linear
density extending to the peripheral margin of the lung, favor
scarring but requiring follow-up to demonstrate stability. Multiple
additional small nodular foci are seen in the upper lobes including
a 3 mm RIGHT upper lobe nodule image 28, 7 x 5 mm LEFT upper lobe
nodule image 40, 4 mm LEFT upper lobe nodules images 20 and 33, 2 mm
LEFT upper lobe nodule image 30 and a 12 x 6 mm LEFT upper lobe
nodule image 24. Observed nodules are stable since the previous
exam. Tiny nodule RIGHT lower lobe image 105 not previously imaged.

Upper Abdomen: Hepatic cyst centrally RIGHT lobe 2.5 x 1.8 cm.
Remaining visualized upper abdomen unremarkable.

Musculoskeletal: No acute osseous abnormalities.
IMPRESSION: Multiple BILATERAL upper lobe pulmonary nodules, the majority of
which are unchanged since the previous exam, with the largest in the
LEFT upper lobe measuring 12 x 6 mm.

Since these lesions are stable since the previous exam, follow-up CT
imaging recommended in 6 months to demonstrate continued stability.

Biapical scarring.

Hepatic cyst 2.5 x 1.8 cm.

## 2022-06-30 ENCOUNTER — Other Ambulatory Visit: Payer: Self-pay | Admitting: Family Medicine

## 2022-06-30 DIAGNOSIS — R911 Solitary pulmonary nodule: Secondary | ICD-10-CM

## 2022-08-02 ENCOUNTER — Ambulatory Visit
Admission: RE | Admit: 2022-08-02 | Discharge: 2022-08-02 | Disposition: A | Discharge status: Home / Self Care | Payer: BC Managed Care – PPO | Source: Ambulatory Visit | Attending: Family Medicine | Admitting: Family Medicine

## 2022-08-02 DIAGNOSIS — R911 Solitary pulmonary nodule: Secondary | ICD-10-CM

## 2023-12-20 ENCOUNTER — Other Ambulatory Visit (HOSPITAL_BASED_OUTPATIENT_CLINIC_OR_DEPARTMENT_OTHER): Payer: Self-pay | Admitting: Family Medicine

## 2023-12-20 DIAGNOSIS — E7800 Pure hypercholesterolemia, unspecified: Secondary | ICD-10-CM

## 2023-12-20 DIAGNOSIS — R918 Other nonspecific abnormal finding of lung field: Secondary | ICD-10-CM

## 2024-01-03 ENCOUNTER — Ambulatory Visit (HOSPITAL_COMMUNITY): Payer: Self-pay

## 2024-01-03 ENCOUNTER — Encounter (HOSPITAL_COMMUNITY): Payer: Self-pay

## 2024-03-21 ENCOUNTER — Other Ambulatory Visit (HOSPITAL_BASED_OUTPATIENT_CLINIC_OR_DEPARTMENT_OTHER): Payer: Self-pay | Admitting: Family Medicine

## 2024-03-21 DIAGNOSIS — E7800 Pure hypercholesterolemia, unspecified: Secondary | ICD-10-CM

## 2024-04-04 ENCOUNTER — Ambulatory Visit (HOSPITAL_BASED_OUTPATIENT_CLINIC_OR_DEPARTMENT_OTHER)
Admission: RE | Admit: 2024-04-04 | Discharge: 2024-04-04 | Disposition: A | Discharge status: Home / Self Care | Payer: Self-pay | Source: Ambulatory Visit | Attending: Family Medicine | Admitting: Family Medicine

## 2024-04-04 DIAGNOSIS — E7800 Pure hypercholesterolemia, unspecified: Secondary | ICD-10-CM | POA: Insufficient documentation

## 2024-04-10 ENCOUNTER — Other Ambulatory Visit: Payer: Self-pay | Admitting: Family Medicine

## 2024-04-10 ENCOUNTER — Encounter: Payer: Self-pay | Admitting: Family Medicine

## 2024-04-10 DIAGNOSIS — R918 Other nonspecific abnormal finding of lung field: Secondary | ICD-10-CM

## 2024-04-12 ENCOUNTER — Ambulatory Visit
Admission: RE | Admit: 2024-04-12 | Discharge: 2024-04-12 | Disposition: A | Discharge status: Home / Self Care | Source: Ambulatory Visit | Attending: Family Medicine | Admitting: Family Medicine

## 2024-04-12 DIAGNOSIS — R918 Other nonspecific abnormal finding of lung field: Secondary | ICD-10-CM
